# Patient Record
Sex: Male | Born: 1940 | Race: Black or African American | Hispanic: No | Marital: Married | Smoking: Current every day smoker
Health system: Southern US, Community
[De-identification: ages and names within clinical notes are randomized; demographics above are authoritative.]

## PROBLEM LIST (undated history)

## (undated) DIAGNOSIS — M1990 Unspecified osteoarthritis, unspecified site: Secondary | ICD-10-CM

## (undated) DIAGNOSIS — C801 Malignant (primary) neoplasm, unspecified: Secondary | ICD-10-CM

## (undated) DIAGNOSIS — I219 Acute myocardial infarction, unspecified: Secondary | ICD-10-CM

## (undated) DIAGNOSIS — C50919 Malignant neoplasm of unspecified site of unspecified female breast: Secondary | ICD-10-CM

## (undated) DIAGNOSIS — I10 Essential (primary) hypertension: Secondary | ICD-10-CM

## (undated) DIAGNOSIS — E119 Type 2 diabetes mellitus without complications: Secondary | ICD-10-CM

## (undated) HISTORY — DX: Type 2 diabetes mellitus without complications: E11.9

## (undated) HISTORY — PX: BREAST SURGERY: SHX581

## (undated) HISTORY — DX: Essential (primary) hypertension: I10

## (undated) HISTORY — PX: SHOULDER ARTHROSCOPY: SHX128

## (undated) HISTORY — DX: Unspecified osteoarthritis, unspecified site: M19.90

## (undated) HISTORY — DX: Acute myocardial infarction, unspecified: I21.9

## (undated) HISTORY — PX: PORT-A-CATH REMOVAL: SHX5289

## (undated) HISTORY — DX: Malignant (primary) neoplasm, unspecified: C80.1

---

## 2005-06-04 HISTORY — PX: OTHER SURGICAL HISTORY: SHX169

## 2005-07-22 ENCOUNTER — Emergency Department: Payer: Self-pay | Admitting: Unknown Physician Specialty

## 2005-08-11 ENCOUNTER — Ambulatory Visit: Payer: Self-pay

## 2005-09-10 ENCOUNTER — Ambulatory Visit: Payer: Self-pay | Admitting: Orthopaedic Surgery

## 2005-09-17 ENCOUNTER — Ambulatory Visit: Payer: Self-pay | Admitting: Orthopaedic Surgery

## 2008-06-04 DIAGNOSIS — C801 Malignant (primary) neoplasm, unspecified: Secondary | ICD-10-CM

## 2008-06-04 HISTORY — DX: Malignant (primary) neoplasm, unspecified: C80.1

## 2008-06-04 HISTORY — PX: PORT A CATH REVISION: SHX6033

## 2008-07-05 ENCOUNTER — Ambulatory Visit: Payer: Self-pay | Admitting: Oncology

## 2008-07-08 ENCOUNTER — Ambulatory Visit: Payer: Self-pay

## 2008-07-28 ENCOUNTER — Ambulatory Visit: Payer: Self-pay | Admitting: Oncology

## 2008-08-02 ENCOUNTER — Ambulatory Visit: Payer: Self-pay | Admitting: Oncology

## 2008-08-04 ENCOUNTER — Ambulatory Visit: Payer: Self-pay | Admitting: Oncology

## 2008-08-13 ENCOUNTER — Ambulatory Visit: Payer: Self-pay | Admitting: General Surgery

## 2008-09-02 ENCOUNTER — Ambulatory Visit: Payer: Self-pay | Admitting: Oncology

## 2008-10-02 ENCOUNTER — Ambulatory Visit: Payer: Self-pay | Admitting: Oncology

## 2008-11-02 ENCOUNTER — Ambulatory Visit: Payer: Self-pay | Admitting: Oncology

## 2008-12-02 ENCOUNTER — Ambulatory Visit: Payer: Self-pay | Admitting: Oncology

## 2008-12-16 ENCOUNTER — Ambulatory Visit: Payer: Self-pay | Admitting: General Surgery

## 2008-12-24 ENCOUNTER — Ambulatory Visit: Payer: Self-pay | Admitting: General Surgery

## 2009-01-02 ENCOUNTER — Ambulatory Visit: Payer: Self-pay | Admitting: Oncology

## 2009-02-02 ENCOUNTER — Ambulatory Visit: Payer: Self-pay | Admitting: Oncology

## 2009-03-04 ENCOUNTER — Ambulatory Visit: Payer: Self-pay | Admitting: Oncology

## 2009-04-04 ENCOUNTER — Ambulatory Visit: Payer: Self-pay | Admitting: Oncology

## 2009-05-04 ENCOUNTER — Ambulatory Visit: Payer: Self-pay | Admitting: Oncology

## 2009-06-04 ENCOUNTER — Ambulatory Visit: Payer: Self-pay | Admitting: Oncology

## 2009-06-04 DIAGNOSIS — C50919 Malignant neoplasm of unspecified site of unspecified female breast: Secondary | ICD-10-CM

## 2009-06-04 HISTORY — PX: MASTECTOMY: SHX3

## 2009-06-04 HISTORY — DX: Malignant neoplasm of unspecified site of unspecified female breast: C50.919

## 2009-07-05 ENCOUNTER — Ambulatory Visit: Payer: Self-pay | Admitting: Oncology

## 2009-08-02 ENCOUNTER — Ambulatory Visit: Payer: Self-pay | Admitting: Oncology

## 2009-09-12 ENCOUNTER — Ambulatory Visit: Payer: Self-pay | Admitting: Oncology

## 2009-10-02 ENCOUNTER — Ambulatory Visit: Payer: Self-pay | Admitting: Oncology

## 2009-10-02 ENCOUNTER — Ambulatory Visit: Payer: Self-pay | Admitting: Radiation Oncology

## 2009-10-20 IMAGING — CR DG CHEST 1V PORT
1 series · 1 of 1 positions shown · non-contrast
Comparison: none

REASON FOR EXAM: post portacath placement
COMMENTS:

[view not recorded]
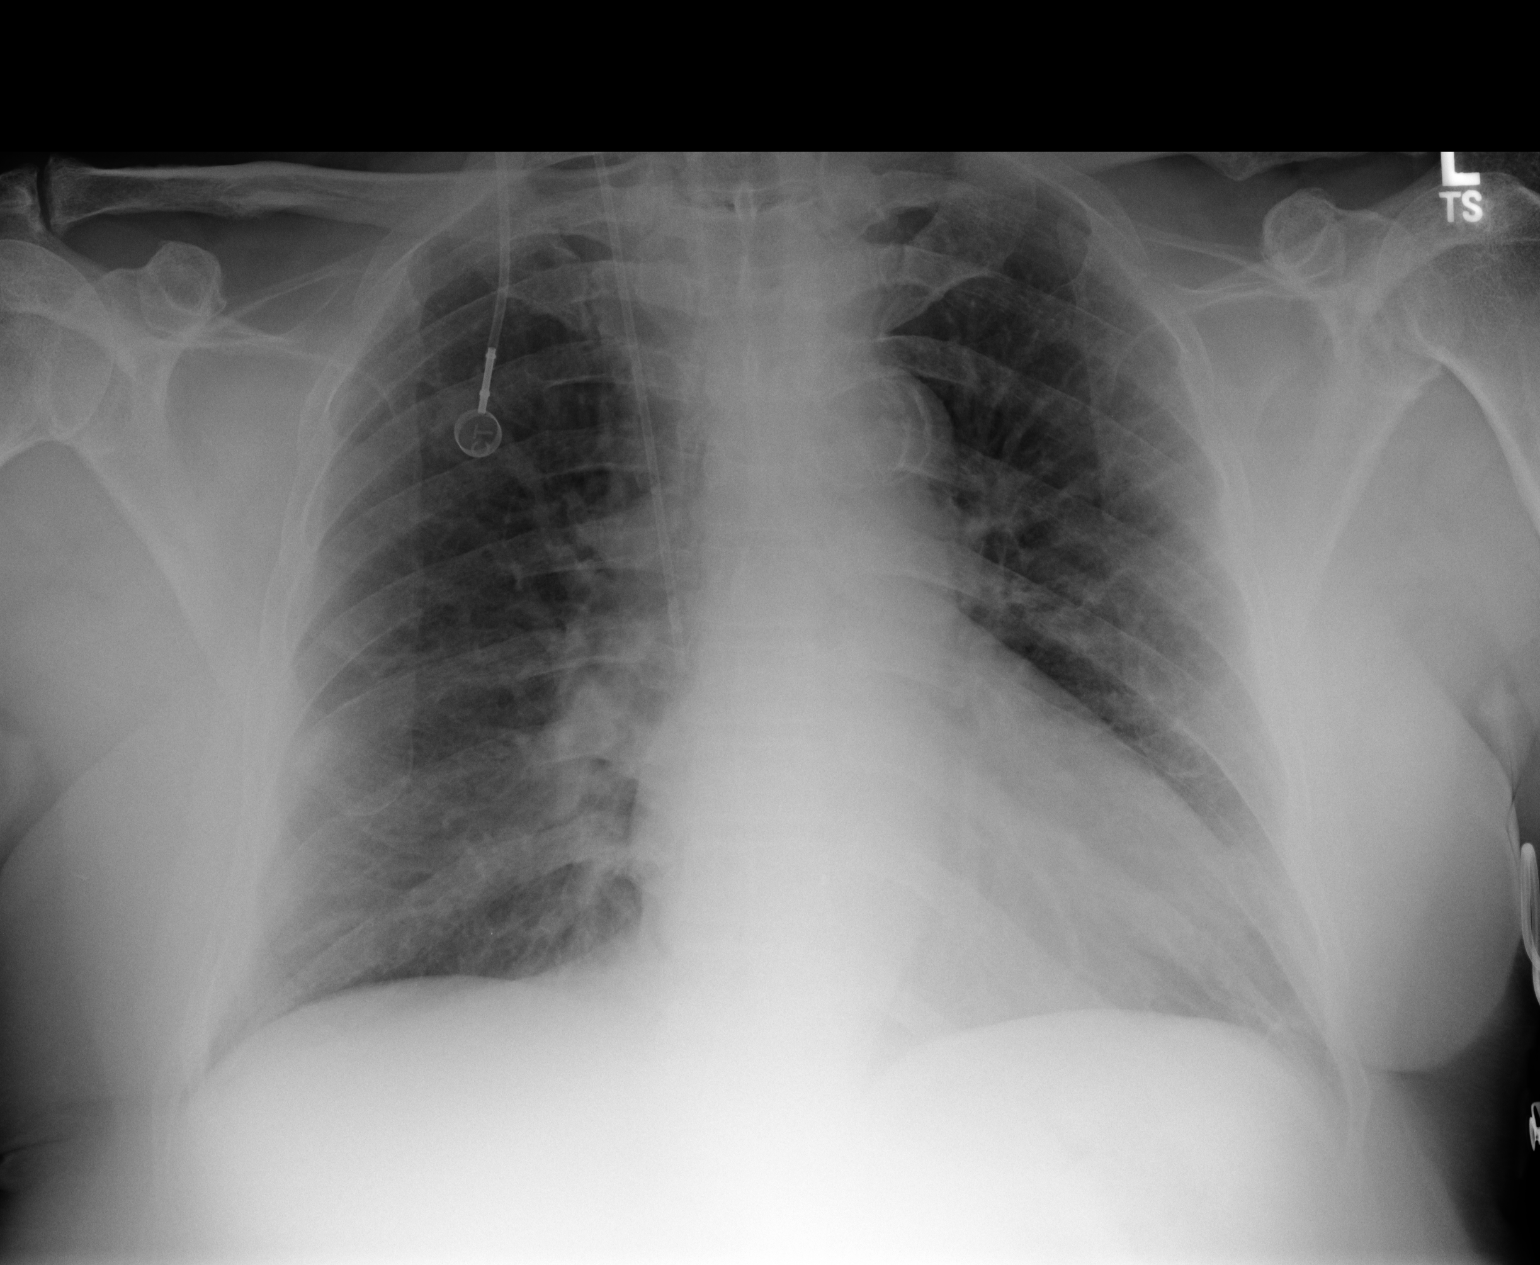

[1 of 1 positions shown; findings below may reference images not displayed]

PROCEDURE:     DXR - DXR PORTABLE CHEST SINGLE VIEW  - August 13, 2008 [DATE]

RESULT:     Comparison is made to the exam of 09/10/2005. A right-sided
central venous catheter has been placed. The port and tube appear to be
within normal limits. The tip of the tube is in the distal superior vena
cava near the right atrium. There is no pneumothorax. Atherosclerotic
calcification is present. The cardiac silhouette appears to be within normal
limits.
IMPRESSION: Port-A-Cath device present as described.

## 2009-11-02 ENCOUNTER — Ambulatory Visit: Payer: Self-pay | Admitting: Radiation Oncology

## 2009-11-02 ENCOUNTER — Ambulatory Visit: Payer: Self-pay | Admitting: Oncology

## 2009-12-02 ENCOUNTER — Ambulatory Visit: Payer: Self-pay | Admitting: Oncology

## 2010-01-04 ENCOUNTER — Ambulatory Visit: Payer: Self-pay | Admitting: Oncology

## 2010-02-02 ENCOUNTER — Ambulatory Visit: Payer: Self-pay | Admitting: Oncology

## 2010-03-02 IMAGING — NM NM SENTINAL NODE INJECTION (BREAST) - NO REPORT
1 series · 2 of 2 positions shown · non-contrast
Comparison: none

REASON FOR EXAM: left breast mastectomy with MEAD   [DATE]  SURG [DATE]
   [id number]  [id number]  [id number]
COMMENTS:
TECHNIQUE: Using sterile technique and a 27 gauge, 1/2 inch needle, the
radiopharmaceutical was injected into the subcutaneous periareolar tissues
of the left breast. Planar images were obtained in the anterior and left
lateral projections, both with and without the use of a Oo-30 transmission
source. The patient's arm was abducted at 90 degrees from the body for the
anterior  images, and raised over the head for the lateral images.

[Series 1000: sent node breast static · 2.40mm/px · 2 of 2 frames shown]
[frame 1/2]
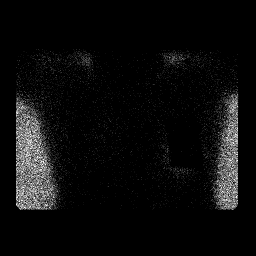
[frame 2/2]
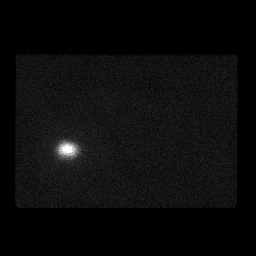

[2 of 2 positions shown; findings below may reference images not displayed]

PROCEDURE:     NM  - NM SENTINEL NODE  BREAST  - December 24, 2008  [DATE]

RESULT:     Comparison:  No comparison.

Radiopharmaceutical:  1.14 mCi [id number] micro filtered sulfur colloid in 2 ml
volume, injected subcutaneously into the periareolar tissues of the left
breast.

Clinical Indication:  68-year-old male with carcinoma of the left breast.
Lymphatic mapping is requested prior to planned left axillary sentinel node
biopsy for interoperative localization.
FINDINGS: Images obtained at one minutes post injection demonstrate
localization of radiotracer in the region of the left periareolar soft
tissue.
IMPRESSION: Successful injection of radiotracer into the left breast
periareolar soft tissues for interoperative localization of left breast
sentinel lymph nodes with a gamma probe.

## 2010-03-04 ENCOUNTER — Ambulatory Visit: Payer: Self-pay | Admitting: Oncology

## 2010-04-04 ENCOUNTER — Ambulatory Visit: Payer: Self-pay | Admitting: Oncology

## 2010-08-07 ENCOUNTER — Ambulatory Visit: Payer: Self-pay | Admitting: Oncology

## 2010-08-08 LAB — CANCER ANTIGEN 27.29: CA 27.29: 25.9 U/mL (ref 0.0–38.6)

## 2010-09-03 ENCOUNTER — Ambulatory Visit: Payer: Self-pay | Admitting: Oncology

## 2010-09-03 IMAGING — NM NM  CARDIAC MUGA REST SCAN 2 0F 2
1 series · 1 of 1 positions shown · non-contrast
Comparison: none

REASON FOR EXAM: breast CA  175.9  high risk meds
COMMENTS:

[Series 1000: lao 45 · 6.59mm/px · 1 of 1 slices shown]
[im 1/1]
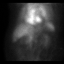

[1 of 1 positions shown; findings below may reference images not displayed]

PROCEDURE:     NM  - NM REST MUGA SCAN [DATE] OF [DATE]  - [DATE] [DATE] [DATE]  [DATE]

RESULT:     Following intravenous administration of 3.0 ml PYP and 24.76 mCi
Technetium 99m Pertechnetate, Rest MUGA scan was performed.

Review of the left ventricular ejection fraction shows no akinetic or
dyskinetic myocardial segments. The left ventricular ejection fraction
measures 53.3% which is within the normal range. The left ventricular
ejection fraction on the prior exam of 03/22/2009 measured 54.5%.
IMPRESSION: The left ventricular ejection fraction measures 53.3% which
is essentially unchanged as compared to the prior exam.

## 2011-01-25 ENCOUNTER — Ambulatory Visit: Payer: Self-pay | Admitting: Oncology

## 2011-01-26 LAB — CANCER ANTIGEN 27.29: CA 27.29: 22.9 U/mL (ref 0.0–38.6)

## 2011-02-03 ENCOUNTER — Ambulatory Visit: Payer: Self-pay | Admitting: Oncology

## 2011-06-05 DIAGNOSIS — I219 Acute myocardial infarction, unspecified: Secondary | ICD-10-CM

## 2011-06-05 HISTORY — PX: CORONARY ANGIOPLASTY WITH STENT PLACEMENT: SHX49

## 2011-06-05 HISTORY — DX: Acute myocardial infarction, unspecified: I21.9

## 2011-07-26 ENCOUNTER — Ambulatory Visit: Payer: Self-pay | Admitting: Oncology

## 2011-07-26 LAB — CBC CANCER CENTER
Basophil #: 0 x10 3/mm (ref 0.0–0.1)
Basophil %: 0.2 %
Eosinophil %: 0.8 %
Lymphocyte #: 0.9 x10 3/mm — ABNORMAL LOW (ref 1.0–3.6)
Lymphocyte %: 18.3 %
MCV: 90 fL (ref 80–100)
Monocyte #: 0.5 x10 3/mm (ref 0.0–0.7)
Monocyte %: 8.8 %
Neutrophil %: 71.9 %
Platelet: 170 x10 3/mm (ref 150–440)
RBC: 4.69 10*6/uL (ref 4.40–5.90)
RDW: 16.5 % — ABNORMAL HIGH (ref 11.5–14.5)
WBC: 5.2 x10 3/mm (ref 3.8–10.6)

## 2011-07-26 LAB — COMPREHENSIVE METABOLIC PANEL
Albumin: 3.6 g/dL (ref 3.4–5.0)
Alkaline Phosphatase: 137 U/L — ABNORMAL HIGH (ref 50–136)
Anion Gap: 6 — ABNORMAL LOW (ref 7–16)
BUN: 20 mg/dL — ABNORMAL HIGH (ref 7–18)
Calcium, Total: 9.7 mg/dL (ref 8.5–10.1)
Co2: 31 mmol/L (ref 21–32)
EGFR (Non-African Amer.): 57 — ABNORMAL LOW
Glucose: 256 mg/dL — ABNORMAL HIGH (ref 65–99)
Osmolality: 291 (ref 275–301)
SGPT (ALT): 39 U/L
Sodium: 140 mmol/L (ref 136–145)

## 2011-07-27 LAB — CANCER ANTIGEN 27.29: CA 27.29: 30 U/mL (ref 0.0–38.6)

## 2011-08-03 ENCOUNTER — Ambulatory Visit: Payer: Self-pay | Admitting: Oncology

## 2011-08-07 DIAGNOSIS — I2109 ST elevation (STEMI) myocardial infarction involving other coronary artery of anterior wall: Secondary | ICD-10-CM | POA: Insufficient documentation

## 2011-08-09 ENCOUNTER — Ambulatory Visit: Payer: Self-pay | Admitting: Oncology

## 2011-08-21 ENCOUNTER — Encounter: Payer: Self-pay | Admitting: Cardiology

## 2011-09-03 ENCOUNTER — Encounter: Payer: Self-pay | Admitting: Cardiology

## 2011-09-03 ENCOUNTER — Ambulatory Visit: Payer: Self-pay | Admitting: Oncology

## 2011-10-03 ENCOUNTER — Encounter: Payer: Self-pay | Admitting: Cardiology

## 2012-02-19 ENCOUNTER — Ambulatory Visit: Payer: Self-pay | Admitting: Oncology

## 2012-02-19 LAB — CBC CANCER CENTER
Basophil #: 0.2 x10 3/mm — ABNORMAL HIGH (ref 0.0–0.1)
Eosinophil #: 0.1 x10 3/mm (ref 0.0–0.7)
HCT: 40.9 % (ref 40.0–52.0)
HGB: 13.7 g/dL (ref 13.0–18.0)
Lymphocyte #: 0.9 x10 3/mm — ABNORMAL LOW (ref 1.0–3.6)
Lymphocyte %: 13.4 %
MCH: 30.4 pg (ref 26.0–34.0)
MCHC: 33.5 g/dL (ref 32.0–36.0)
MCV: 91 fL (ref 80–100)
Monocyte %: 8.8 %
Neutrophil #: 4.7 x10 3/mm (ref 1.4–6.5)
Platelet: 171 x10 3/mm (ref 150–440)
RDW: 16.7 % — ABNORMAL HIGH (ref 11.5–14.5)

## 2012-02-19 LAB — COMPREHENSIVE METABOLIC PANEL
Albumin: 3.8 g/dL (ref 3.4–5.0)
Alkaline Phosphatase: 95 U/L (ref 50–136)
BUN: 18 mg/dL (ref 7–18)
Chloride: 106 mmol/L (ref 98–107)
Co2: 26 mmol/L (ref 21–32)
Creatinine: 1.34 mg/dL — ABNORMAL HIGH (ref 0.60–1.30)
Osmolality: 285 (ref 275–301)
Potassium: 3.6 mmol/L (ref 3.5–5.1)
SGOT(AST): 23 U/L (ref 15–37)
SGPT (ALT): 21 U/L (ref 12–78)
Total Protein: 7.7 g/dL (ref 6.4–8.2)

## 2012-02-20 LAB — CANCER ANTIGEN 27.29: CA 27.29: 24.1 U/mL (ref 0.0–38.6)

## 2012-03-04 ENCOUNTER — Ambulatory Visit: Payer: Self-pay | Admitting: Oncology

## 2012-08-11 ENCOUNTER — Ambulatory Visit: Payer: Self-pay | Admitting: Oncology

## 2012-08-18 ENCOUNTER — Ambulatory Visit: Payer: Self-pay | Admitting: Oncology

## 2012-08-19 LAB — COMPREHENSIVE METABOLIC PANEL
Albumin: 3.7 g/dL (ref 3.4–5.0)
Alkaline Phosphatase: 97 U/L (ref 50–136)
Anion Gap: 6 — ABNORMAL LOW (ref 7–16)
BUN: 14 mg/dL (ref 7–18)
Bilirubin,Total: 0.4 mg/dL (ref 0.2–1.0)
Co2: 31 mmol/L (ref 21–32)
EGFR (African American): >60
Glucose: 94 mg/dL (ref 65–99)
Osmolality: 280 (ref 275–301)
SGOT(AST): 23 U/L (ref 15–37)
SGPT (ALT): 28 U/L (ref 12–78)
Sodium: 140 mmol/L (ref 136–145)

## 2012-08-19 LAB — CBC CANCER CENTER
Basophil #: 0.1 x10 3/mm (ref 0.0–0.1)
Eosinophil #: 0.1 x10 3/mm (ref 0.0–0.7)
Eosinophil %: 1.1 %
HCT: 42.6 % (ref 40.0–52.0)
Lymphocyte %: 21.8 %
MCHC: 33.6 g/dL (ref 32.0–36.0)
MCV: 90 fL (ref 80–100)
Neutrophil %: 65.8 %
Platelet: 186 x10 3/mm (ref 150–440)
RBC: 4.73 10*6/uL (ref 4.40–5.90)
RDW: 16.1 % — ABNORMAL HIGH (ref 11.5–14.5)

## 2012-08-20 LAB — CANCER ANTIGEN 27.29: CA 27.29: 26.2 U/mL (ref 0.0–38.6)

## 2012-08-25 ENCOUNTER — Ambulatory Visit: Payer: Self-pay | Admitting: Oncology

## 2012-09-02 ENCOUNTER — Ambulatory Visit: Payer: Self-pay | Admitting: Oncology

## 2012-09-03 ENCOUNTER — Ambulatory Visit (INDEPENDENT_AMBULATORY_CARE_PROVIDER_SITE_OTHER): Payer: Medicare Other | Admitting: General Surgery

## 2012-09-03 ENCOUNTER — Encounter: Payer: Self-pay | Admitting: General Surgery

## 2012-09-03 ENCOUNTER — Telehealth: Payer: Self-pay | Admitting: *Deleted

## 2012-09-03 VITALS — BP 140/70 | HR 70 | Resp 18 | Ht 71.0 in | Wt 245.0 lb

## 2012-09-03 DIAGNOSIS — Z853 Personal history of malignant neoplasm of breast: Secondary | ICD-10-CM | POA: Insufficient documentation

## 2012-09-03 DIAGNOSIS — N63 Unspecified lump in unspecified breast: Secondary | ICD-10-CM

## 2012-09-03 NOTE — Telephone Encounter (Signed)
Patient has been left a message on home and cell numbers to call the office. I also called his work number but he was not there. Patient is on Effient and biopsy will not be able to be completed on 09-05-12. This patient will need to take last dose on Thursday, 09-04-12, if we can reschedule his biopsy to 09-09-12 at 8:30 am.

## 2012-09-03 NOTE — Patient Instructions (Addendum)
Patient to have a right core biopsy . He is agreeable. Pt is on Effient, needs to stop this fo at least 3 days prior to procedure

## 2012-09-03 NOTE — Progress Notes (Signed)
Patient ID: Benjamin Krause, male   DO: 06-02-1941, 72 y.o.   MRM: [id number]  Chief Complaint  Patient presents with  . Breast Problem    HPI Benjamin Krause is a 72 y.o. male.  Patient here today for right breast evaluation referred by Dr Doylene Canning. 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 The patient has been treated with mastectomy, chemotherapy and radiation therapy. He had a heart attack in Feb 2013 which he was treated with a cardiac stent.Resent follow up mammogram show irregularl nodular below right nipple  HPI  Past Medical History  Diagnosis Date  . Cancer 2010    left breast  . Hypertension   . Heart attack   . Arthritis   . Diabetes mellitus without complication     Past Surgical History  Procedure Laterality Date  . Breast surgery    . Shoulder arthroscopy    . Coronary angioplasty with stent placement  2013  . I&d cyst on upper back   2007  . Port a cath revision  2010  . Mastectomy Left   . Port-a-cath removal      No family history on file.  Social History History  Substance Use Topics  . Smoking status: Current Every Day Smoker -- 1.00 packs/day for 40 years    Types: Cigarettes  . Smokeless tobacco: Never Used  . Alcohol Use: No    No Known Allergies  Current Outpatient Prescriptions  Medication Sig Dispense Refill  . atorvastatin (LIPITOR) 80 MG tablet       . EFFIENT 10 MG TABS       . letrozole (FEMARA) 2.5 MG tablet       . lisinopril (PRINIVIL,ZESTRIL) 2.5 MG tablet       . metFORMIN (GLUCOPHAGE) 1000 MG tablet       . metoprolol succinate (TOPROL-XL) 50 MG 24 hr tablet        No current facility-administered medications for this visit.    Review of Systems Review of Systems  Constitutional: Negative.   Respiratory: Negative.   Cardiovascular: Negative.     Blood pressure 140/70, pulse 70, resp. rate 18, height 5\' 11"  (1.803 m), weight 245 lb (111.131 kg).  Physical Exam Physical Exam  Constitutional: He  appears well-developed.  Eyes: Conjunctivae are normal.  Neck: Normal range of motion. Neck supple.  Cardiovascular: Normal rate, regular rhythm and normal heart sounds.   Pulmonary/Chest: Effort normal and breath sounds normal. Right breast exhibits no inverted nipple, no mass, no nipple discharge, no skin change and no tenderness.    Abdominal: Soft. Normal appearance and bowel sounds are normal. There is no tenderness.  Lymphadenopathy:    He has no cervical adenopathy.    He has no axillary adenopathy.    Data Reviewed Mammogram right showed a tiny irregular density behind nipple. US showed similar finding with mild shadowing.   Assessment    New right breastr finding on imaging, not palpable. S/P left mastectomy for CA     Plan    Core biopsy right breast        Currie Paris 09/03/2012, 11:26 AM

## 2012-09-04 NOTE — Telephone Encounter (Signed)
Patient is aware of all instructions and verbalizes understanding.

## 2012-09-05 ENCOUNTER — Ambulatory Visit: Payer: Medicare Other | Admitting: General Surgery

## 2012-09-09 ENCOUNTER — Ambulatory Visit (INDEPENDENT_AMBULATORY_CARE_PROVIDER_SITE_OTHER): Payer: Medicare Other | Admitting: General Surgery

## 2012-09-09 ENCOUNTER — Encounter: Payer: Self-pay | Admitting: General Surgery

## 2012-09-09 VITALS — BP 134/64 | HR 72 | Resp 16 | Ht 71.0 in | Wt 249.0 lb

## 2012-09-09 DIAGNOSIS — N63 Unspecified lump in unspecified breast: Secondary | ICD-10-CM

## 2012-09-09 HISTORY — PX: BREAST BIOPSY: SHX20

## 2012-09-09 NOTE — Patient Instructions (Addendum)
CARE AFTER BREAST BIOPSY  1. Leave the dressing on that your doctor applied after surgery. It is waterproof. You may bathe, shower and/or swim. The dressing will probably remain intact until your return office visit. If the dressing comes off, you will see small strips of tape against your skin on the incision. Do not remove these strips.  2. You may want to use a gauze,cloth or similar protection in your bra to prevent rubbing against your dressing and incision. This is not necessary, but you may feel more comfortable doing so.  3. It is recommended that you wear a bra day and night to give support to the breast. This will prevent the weight of the breast from pulling on the incision.  4. Your breast will feel hard and lumpy under the incision. Do not be alarmed. This is the underlying stitching of tissue. Softening of this tissue will occur in time.  5. Make sure you call the office and schedule an appointment in one week after your surgery. The office phone number is [phone]. The nurses at [facility] may have already done this for you.  6. You will notice about a week after your office visit that the strips of the tape on your incision will begin to loosen. These may then be removed.  7. Report to your doctor any of the following:  * Severe pain not relieved by your pain medication  *Redness of the incision  * Drainage from the incision  *Fever greater than 101 degrees   Start back on medication on Wednesday.

## 2012-09-09 NOTE — Progress Notes (Signed)
Patient ID: Benjamin Krause, male   DOB: Dec 09, 1940, 72 y.o.   MRN: [id number]  Chief Complaint  Patient presents with  . Other    core biopsy     HPI Benjamin Krause is a 72 y.o. male right breast core biopsy . HPI  Past Medical History  Diagnosis Date  . Cancer 2010    left breast  . Hypertension   . Arthritis   . Diabetes mellitus without complication   . Heart attack 2013    Past Surgical History  Procedure Laterality Date  . Breast surgery    . Shoulder arthroscopy    . Coronary angioplasty with stent placement  2013  . I&d cyst on upper back   2007  . Port a cath revision  2010  . Mastectomy Left   . Port-a-cath removal      History reviewed. No pertinent family history.  Social History History  Substance Use Topics  . Smoking status: Current Every Day Smoker -- 1.00 packs/day for 40 years    Types: Cigarettes  . Smokeless tobacco: Never Used  . Alcohol Use: No    No Known Allergies  Current Outpatient Prescriptions  Medication Sig Dispense Refill  . atorvastatin (LIPITOR) 80 MG tablet       . EFFIENT 10 MG TABS       . letrozole (FEMARA) 2.5 MG tablet       . lisinopril (PRINIVIL,ZESTRIL) 2.5 MG tablet       . metFORMIN (GLUCOPHAGE) 1000 MG tablet       . metoprolol succinate (TOPROL-XL) 50 MG 24 hr tablet        No current facility-administered medications for this visit.    Review of Systems Review of Systems  Constitutional: Negative.   Respiratory: Negative.   Cardiovascular: Negative.     Blood pressure 134/64, pulse 72, resp. rate 16, height 5\' 11"  (1.803 m), weight 249 lb (112.946 kg).  Physical Exam Physical Exam Pt was here for planned core biopsy of right breast mass  Data Reviewed    Assessment          Plan    Core biopsy completed        SANKAR,SEEPLAPUTHUR G 09/09/2012, 9:17 AM

## 2012-09-10 LAB — PATHOLOGY

## 2012-09-16 ENCOUNTER — Encounter: Payer: Self-pay | Admitting: General Surgery

## 2012-10-03 ENCOUNTER — Telehealth: Payer: Self-pay | Admitting: *Deleted

## 2012-10-03 NOTE — Telephone Encounter (Signed)
Patient aware pathology was benign.

## 2012-12-02 DIAGNOSIS — Z720 Tobacco use: Secondary | ICD-10-CM | POA: Insufficient documentation

## 2012-12-02 DIAGNOSIS — I2510 Atherosclerotic heart disease of native coronary artery without angina pectoris: Secondary | ICD-10-CM | POA: Insufficient documentation

## 2013-02-16 ENCOUNTER — Ambulatory Visit: Payer: Self-pay | Admitting: Oncology

## 2013-02-17 LAB — CBC CANCER CENTER
Basophil #: 0.1 x10 3/mm (ref 0.0–0.1)
Basophil %: 1.2 %
Eosinophil %: 1.1 %
Lymphocyte %: 29.1 %
MCH: 30.5 pg (ref 26.0–34.0)
Monocyte #: 0.9 x10 3/mm (ref 0.2–1.0)
Monocyte %: 13.1 %
Neutrophil #: 3.8 x10 3/mm (ref 1.4–6.5)
Platelet: 185 x10 3/mm (ref 150–440)
RBC: 4.77 10*6/uL (ref 4.40–5.90)
RDW: 16.5 % — ABNORMAL HIGH (ref 11.5–14.5)
WBC: 6.9 x10 3/mm (ref 3.8–10.6)

## 2013-02-17 LAB — COMPREHENSIVE METABOLIC PANEL
Albumin: 3.7 g/dL (ref 3.4–5.0)
Alkaline Phosphatase: 119 U/L (ref 50–136)
Calcium, Total: 9.9 mg/dL (ref 8.5–10.1)
Chloride: 104 mmol/L (ref 98–107)
Co2: 29 mmol/L (ref 21–32)
EGFR (African American): >60
Glucose: 96 mg/dL (ref 65–99)
Osmolality: 283 (ref 275–301)
Potassium: 3.8 mmol/L (ref 3.5–5.1)
SGOT(AST): 18 U/L (ref 15–37)
SGPT (ALT): 21 U/L (ref 12–78)
Sodium: 142 mmol/L (ref 136–145)
Total Protein: 7.4 g/dL (ref 6.4–8.2)

## 2013-02-26 ENCOUNTER — Ambulatory Visit (INDEPENDENT_AMBULATORY_CARE_PROVIDER_SITE_OTHER): Payer: Medicare Other | Admitting: General Surgery

## 2013-02-26 ENCOUNTER — Encounter: Payer: Self-pay | Admitting: General Surgery

## 2013-02-26 VITALS — BP 130/62 | HR 72 | Resp 16 | Wt 248.0 lb

## 2013-02-26 DIAGNOSIS — Z853 Personal history of malignant neoplasm of breast: Secondary | ICD-10-CM

## 2013-02-26 DIAGNOSIS — I890 Lymphedema, not elsewhere classified: Secondary | ICD-10-CM

## 2013-02-26 NOTE — Patient Instructions (Addendum)
Call office for any new breast issues or concerns. Wear Lymphedema sleeve get from [employer]

## 2013-02-26 NOTE — Progress Notes (Signed)
Patient ID: Benjamin Krause, male   DOB: [birth year], 72 y.o.   MRN: [id number]  Chief Complaint  Patient presents with  . Follow-up    HPI Benjamin Krause is a 72 y.o. male.  Here today for follow up breast cancer. 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 The patient was treated with mastectomy, chemotherapy and radiation therapy. On 09-11-12 a right breat biopsy was done and it was benign. No new complaints.  HPI  Past Medical History  Diagnosis Date  . Hypertension   . Arthritis   . Diabetes mellitus without complication   . Heart attack 2013  . Cancer 2010    left breast  invasive ductal carcinoma    Past Surgical History  Procedure Laterality Date  . Shoulder arthroscopy    . Coronary angioplasty with stent placement  2013  . I&d cyst on upper back   2007  . Port a cath revision  2010  . Mastectomy Left   . Port-a-cath removal    . Breast surgery    . Breast biopsy Right 09-09-12    BENIGN BREAST TISSUE WITH FIBROSIS    History reviewed. No pertinent family history.  Social History History  Substance Use Topics  . Smoking status: Current Every Day Smoker -- 1.00 packs/day for 40 years    Types: Cigarettes  . Smokeless tobacco: Never Used  . Alcohol Use: No    No Known Allergies  Current Outpatient Prescriptions  Medication Sig Dispense Refill  . atorvastatin (LIPITOR) 80 MG tablet       . letrozole (FEMARA) 2.5 MG tablet       . lisinopril (PRINIVIL,ZESTRIL) 2.5 MG tablet       . metFORMIN (GLUCOPHAGE) 1000 MG tablet       . metoprolol succinate (TOPROL-XL) 50 MG 24 hr tablet        No current facility-administered medications for this visit.    Review of Systems Review of Systems  Constitutional: Negative.   Respiratory: Negative.   Cardiovascular: Negative.     Blood pressure 130/62, pulse 72, resp. rate 16, weight 248 lb (112.492 kg).  Physical Exam Physical Exam  Constitutional: He is oriented to person, place, and  time. He appears well-developed and well-nourished.  Eyes: Conjunctivae are normal. No scleral icterus.  Neck: Neck supple. No thyromegaly present.  Cardiovascular: Normal rate, regular rhythm and normal heart sounds.   Pulses:      Dorsalis pedis pulses are 2+ on the right side, and 0 on the left side.       Posterior tibial pulses are 2+ on the right side, and 2+ on the left side.  Bilateral Lower extremity edema. healed areas of stasis skin changes lower 1/3 of both legs   Pulmonary/Chest: Effort normal and breath sounds normal. Right breast exhibits no inverted nipple, no mass, no nipple discharge, no skin change and no tenderness.  Left mastectomy site well healed Lymphedema left arm present  Abdominal: Soft. Bowel sounds are normal. There is no hepatosplenomegaly. There is no tenderness.  Lymphadenopathy:    He has no cervical adenopathy.    He has no axillary adenopathy.  Neurological: He is alert and oriented to person, place, and time.  Skin: Skin is warm and dry.    Data Reviewed none  Assessment    Stable exam    Plan    Consider lymphedema compression sleeve for left arm, prescription given. Follow up in one year.  SANKAR,SEEPLAPUTHUR G 02/26/2013, 8:27 PM

## 2013-02-27 ENCOUNTER — Encounter: Payer: Self-pay | Admitting: *Deleted

## 2013-03-04 ENCOUNTER — Ambulatory Visit: Payer: Self-pay | Admitting: Oncology

## 2013-08-17 DIAGNOSIS — J209 Acute bronchitis, unspecified: Secondary | ICD-10-CM | POA: Insufficient documentation

## 2013-08-19 ENCOUNTER — Ambulatory Visit: Payer: Self-pay | Admitting: Oncology

## 2013-08-27 LAB — COMPREHENSIVE METABOLIC PANEL
Albumin: 3.1 g/dL — ABNORMAL LOW (ref 3.4–5.0)
Alkaline Phosphatase: 102 U/L
Anion Gap: 8 (ref 7–16)
BILIRUBIN TOTAL: 0.3 mg/dL (ref 0.2–1.0)
BUN: 23 mg/dL — ABNORMAL HIGH (ref 7–18)
CHLORIDE: 104 mmol/L (ref 98–107)
Calcium, Total: 9.7 mg/dL (ref 8.5–10.1)
Co2: 30 mmol/L (ref 21–32)
Creatinine: 1.69 mg/dL — ABNORMAL HIGH (ref 0.60–1.30)
EGFR (African American): 46 — ABNORMAL LOW
EGFR (Non-African Amer.): 39 — ABNORMAL LOW
GLUCOSE: 180 mg/dL — AB (ref 65–99)
OSMOLALITY: 291 (ref 275–301)
POTASSIUM: 4.7 mmol/L (ref 3.5–5.1)
SGOT(AST): 24 U/L (ref 15–37)
SGPT (ALT): 29 U/L (ref 12–78)
Sodium: 142 mmol/L (ref 136–145)
Total Protein: 7.4 g/dL (ref 6.4–8.2)

## 2013-08-27 LAB — CBC CANCER CENTER
BASOS ABS: 0.1 x10 3/mm (ref 0.0–0.1)
BASOS PCT: 0.9 %
EOS PCT: 0.8 %
Eosinophil #: 0.1 x10 3/mm (ref 0.0–0.7)
HCT: 40.9 % (ref 40.0–52.0)
HGB: 13.4 g/dL (ref 13.0–18.0)
LYMPHS PCT: 16.5 %
Lymphocyte #: 1.2 x10 3/mm (ref 1.0–3.6)
MCH: 30.6 pg (ref 26.0–34.0)
MCHC: 32.8 g/dL (ref 32.0–36.0)
MCV: 94 fL (ref 80–100)
Monocyte #: 0.7 x10 3/mm (ref 0.2–1.0)
Monocyte %: 9.6 %
Neutrophil #: 5.4 x10 3/mm (ref 1.4–6.5)
Neutrophil %: 72.2 %
Platelet: 286 x10 3/mm (ref 150–440)
RBC: 4.38 10*6/uL — AB (ref 4.40–5.90)
RDW: 15.4 % — ABNORMAL HIGH (ref 11.5–14.5)
WBC: 7.5 x10 3/mm (ref 3.8–10.6)

## 2013-08-28 LAB — CANCER ANTIGEN 27.29: CA 27.29: 27.5 U/mL (ref 0.0–38.6)

## 2013-09-02 ENCOUNTER — Ambulatory Visit: Payer: Self-pay | Admitting: Oncology

## 2013-09-21 DIAGNOSIS — E119 Type 2 diabetes mellitus without complications: Secondary | ICD-10-CM | POA: Insufficient documentation

## 2013-10-02 ENCOUNTER — Ambulatory Visit: Payer: Self-pay | Admitting: Oncology

## 2013-11-01 IMAGING — US ULTRASOUND RIGHT BREAST
1 series · 14 of 17 positions shown · non-contrast
Comparison: none

REASON FOR EXAM: av rt asymmetric density
COMMENTS:

PROCEDURE:     US  - US BREAST RIGHT  - August 25, 2012 [DATE]
RESULT:     Focused right breast ultrasound dated 08/25/2012

[Series 1: ultrasound right breast · 0.08mm/px · 14 of 17 slices shown]
[im 1/17]
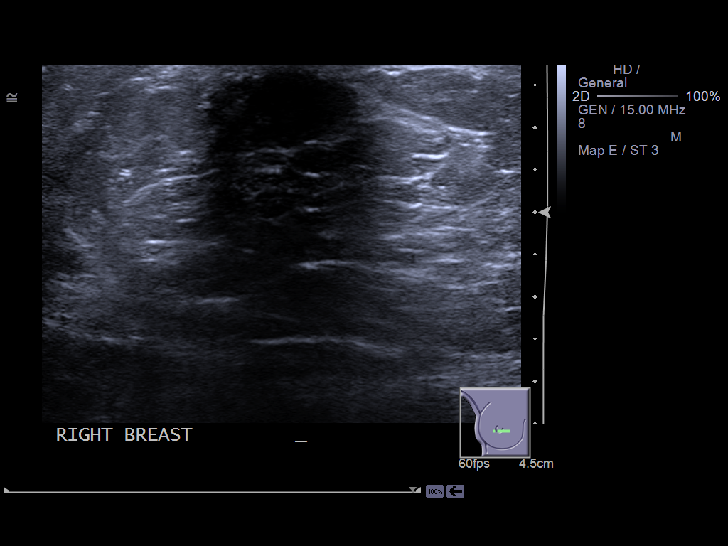
[im 2/17]
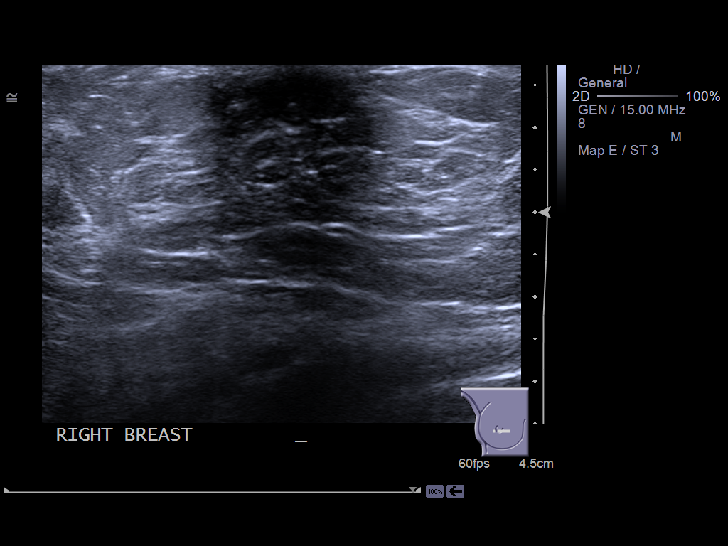
[im 4/17]
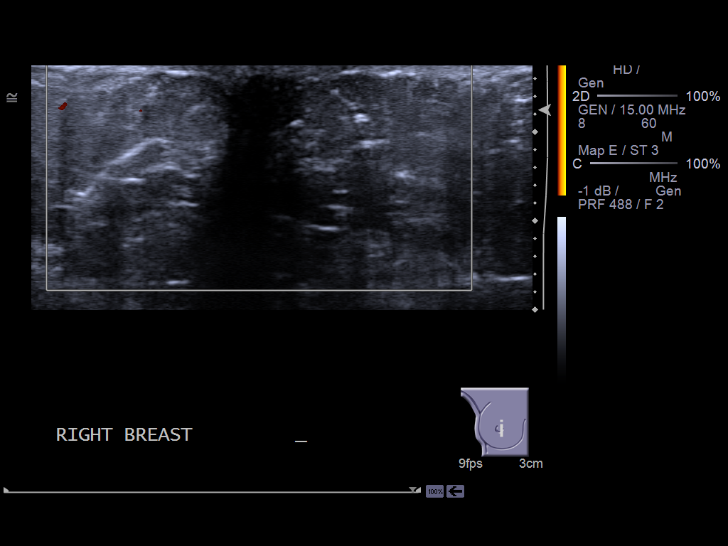
[im 5/17]
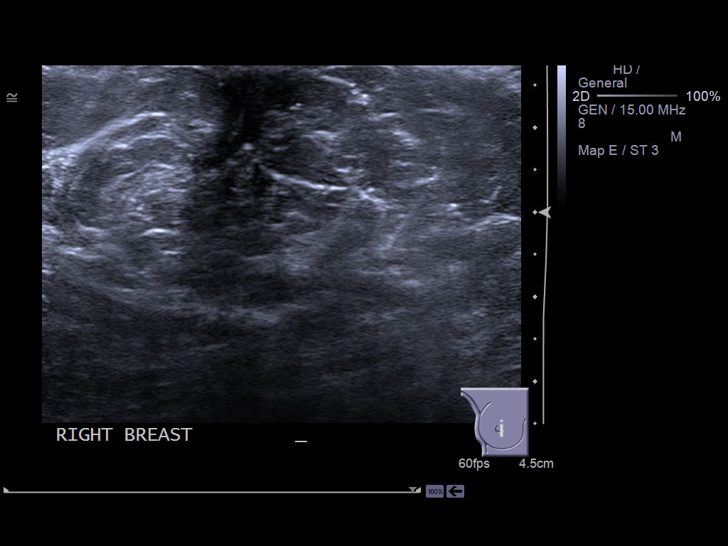
[im 6/17]
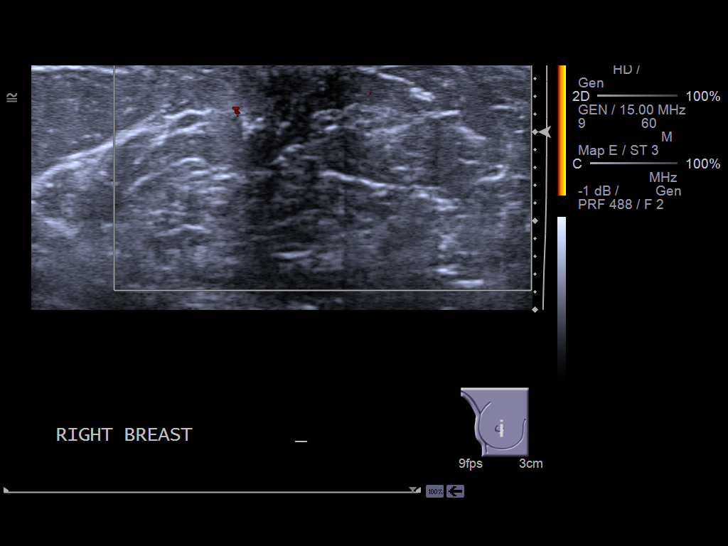
[im 7/17]
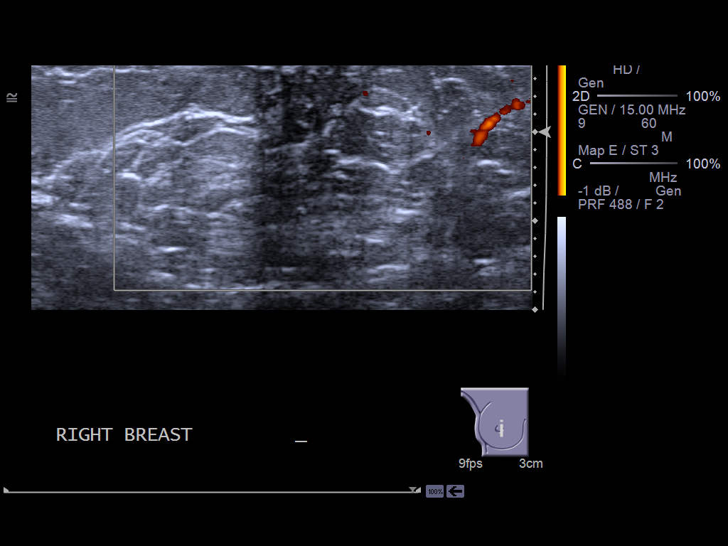
[im 8/17]
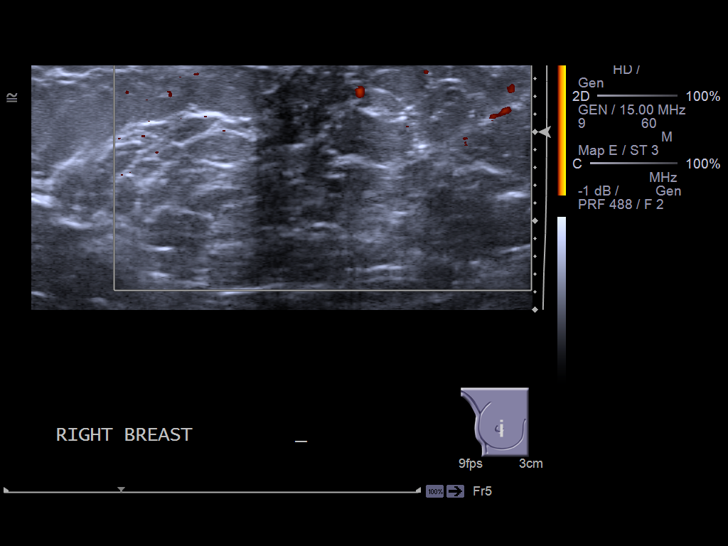
[im 10/17]
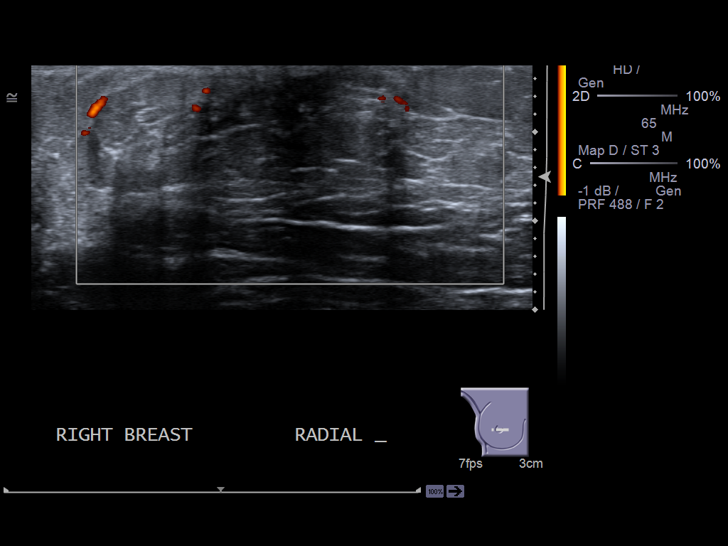
[im 11/17]
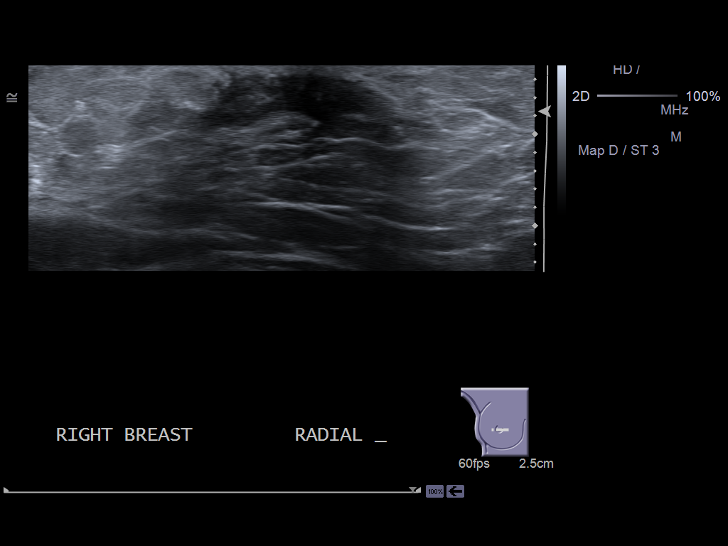
[im 12/17]
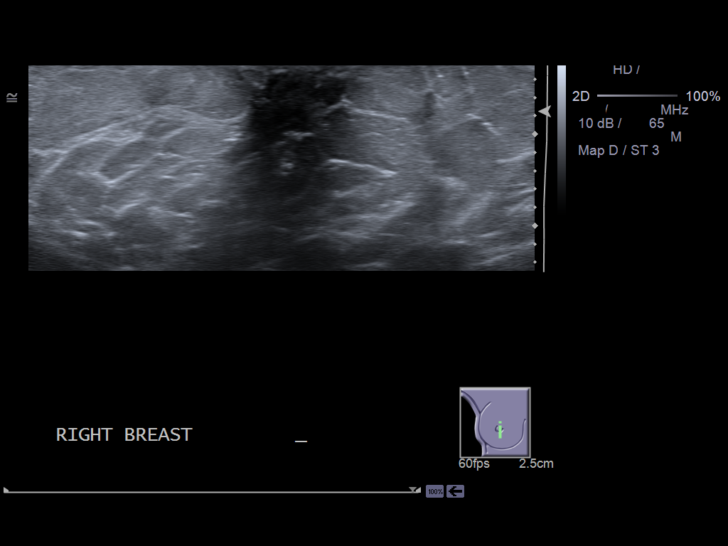
[im 13/17]
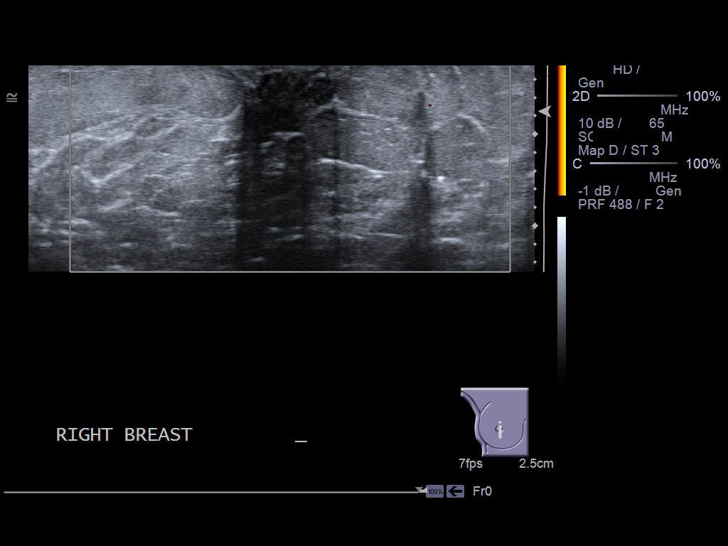
[im 14/17]
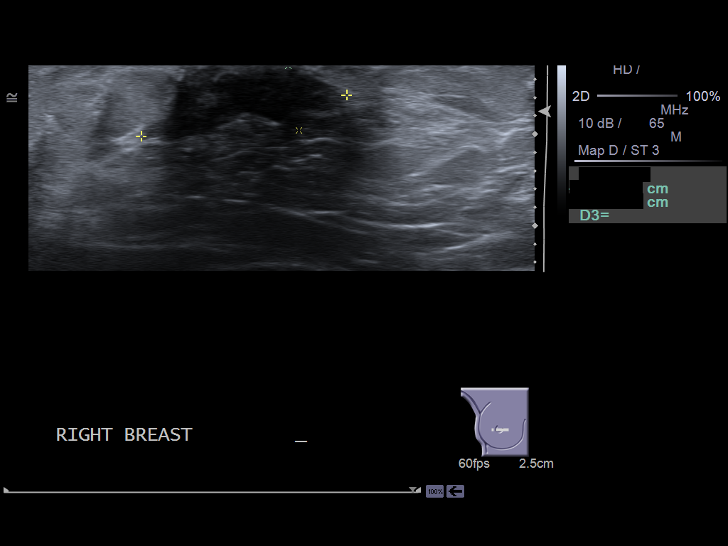
[im 16/17]
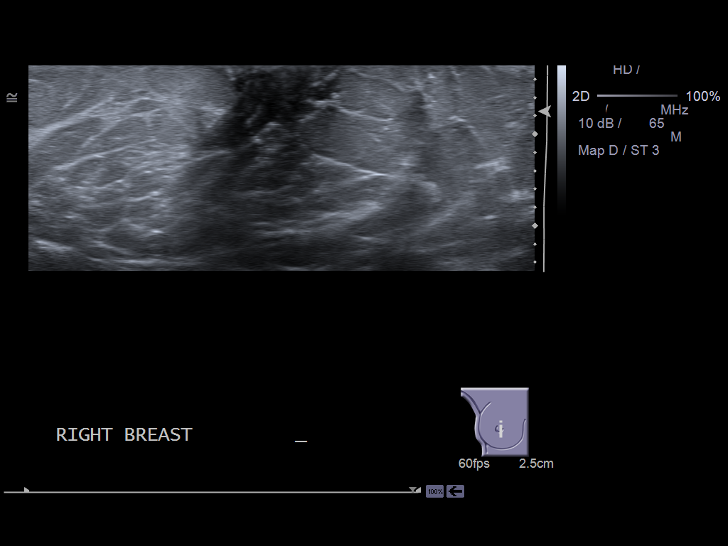
[im 17/17]
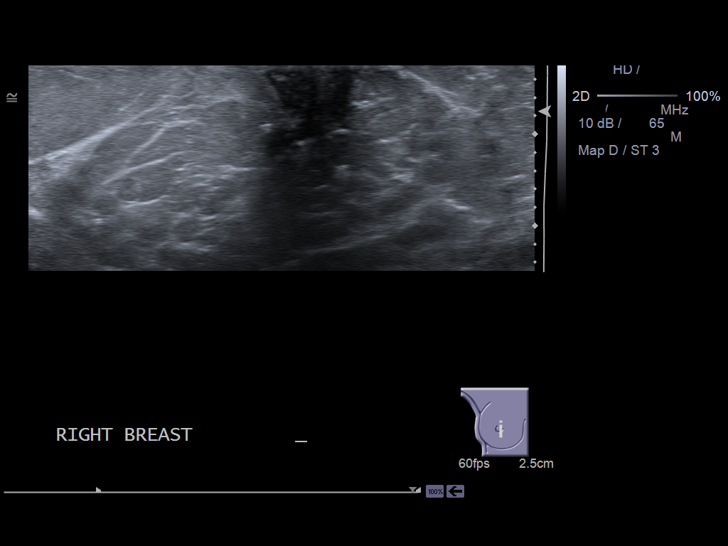

[14 of 17 positions shown; findings below may reference images not displayed]

FINDINGS: A hypoechoic irregularly bordered nodule with acoustic shadowing
is appreciated within the  retroareolar portion of the right breast directly
behind the nipple. This area measures 2.9 x 0.72 x 1.7 cm. Minimal
vascularity is identified along the periphery of the nodule. Sonographic
characteristics of this nodule are concerning and further evaluation with
surgical consultation recommended.
IMPRESSION: Concerning nodule directly behind the nipple as described
above please refer to the additional radiographic view dictation for
complete discussion.

## 2014-03-02 ENCOUNTER — Ambulatory Visit (INDEPENDENT_AMBULATORY_CARE_PROVIDER_SITE_OTHER): Payer: Medicare Other | Admitting: General Surgery

## 2014-03-02 ENCOUNTER — Encounter: Payer: Self-pay | Admitting: General Surgery

## 2014-03-02 VITALS — BP 132/70 | HR 80 | Resp 12 | Ht 71.0 in | Wt 231.0 lb

## 2014-03-02 DIAGNOSIS — I890 Lymphedema, not elsewhere classified: Secondary | ICD-10-CM

## 2014-03-02 DIAGNOSIS — Z853 Personal history of malignant neoplasm of breast: Secondary | ICD-10-CM

## 2014-03-02 NOTE — Patient Instructions (Signed)
Patient to follow up in 1 year. The patient is aware to call back for any questions or concerns.

## 2014-03-02 NOTE — Progress Notes (Signed)
Patient ID: Benjamin Poythress., male   DOB: [birth year], 73 y.o.   MRN: [id number]  Chief Complaint  Patient presents with  . Follow-up    1 year follow up breast cancer    HPI Benjamin Krause. is a 73 y.o. male who presents for a 1 year follow up breast cancer. No mammogram at this time. He is status post left mastectomy with chemotherapy for breast cancer. Doing well. No new problems at this time.   HPI  Past Medical History  Diagnosis Date  . Hypertension   . Arthritis   . Diabetes mellitus without complication   . Heart attack 2013  . Cancer 2010    left breast  invasive ductal carcinoma    Past Surgical History  Procedure Laterality Date  . Shoulder arthroscopy    . Coronary angioplasty with stent placement  2013  . I&d cyst on upper back   2007  . Port a cath revision  2010  . Mastectomy Left   . Port-a-cath removal    . Breast surgery    . Breast biopsy Right 09-09-12    BENIGN BREAST TISSUE WITH FIBROSIS    History reviewed. No pertinent family history.  Social History History  Substance Use Topics  . Smoking status: Current Every Day Smoker -- 1.00 packs/day for 40 years    Types: Cigarettes  . Smokeless tobacco: Never Used  . Alcohol Use: No    No Known Allergies  Current Outpatient Prescriptions  Medication Sig Dispense Refill  . aspirin 81 MG tablet Take 81 mg by mouth daily.      Marland Kitchen atorvastatin (LIPITOR) 80 MG tablet Take 80 mg by mouth daily.       . Calcium Carbonate-Vitamin D (CALCIUM + D PO) Take 1 tablet by mouth 2 (two) times daily.      Marland Kitchen glimepiride (AMARYL) 1 MG tablet Take 1 tablet by mouth as needed.      Marland Kitchen letrozole (FEMARA) 2.5 MG tablet Take 2.5 mg by mouth daily.       Marland Kitchen lisinopril (PRINIVIL,ZESTRIL) 2.5 MG tablet Take 2.5 mg by mouth daily.       . metFORMIN (GLUCOPHAGE) 1000 MG tablet Take 1,000 mg by mouth 2 (two) times daily.       . metoprolol succinate (TOPROL-XL) 50 MG 24 hr tablet Take 50 mg by mouth daily.        No current  facility-administered medications for this visit.    Review of Systems Review of Systems  Constitutional: Negative.   Respiratory: Negative.   Cardiovascular: Negative.     Blood pressure 132/70, pulse 80, resp. rate 12, height 5\' 11"  (1.803 m), weight 231 lb (104.781 kg).  Physical Exam Physical Exam  Constitutional: He is oriented to person, place, and time. He appears well-developed and well-nourished.  Eyes: Conjunctivae are normal. No scleral icterus.  Neck: Neck supple. No thyromegaly present.  Cardiovascular: Normal rate, regular rhythm and normal heart sounds.   No murmur heard. Pulmonary/Chest: Effort normal and breath sounds normal. Right breast exhibits no inverted nipple, no mass, no nipple discharge, no skin change and no tenderness.  Well healed left mastectomy site.   Lymphadenopathy:    He has no cervical adenopathy.    He has no axillary adenopathy.  Mild lymphedema in left arm.   Neurological: He is alert and oriented to person, place, and time.  Skin: Skin is warm and dry.    Data Reviewed  None  Assessment  7yrs post left breast cancer treatment.  Mild lymphedema in left arm. Otherwise exam stable.     Plan    Patient to follow up in 1 year          Laveda Demedeiros G 03/02/2014, 11:19 AM

## 2014-03-15 ENCOUNTER — Ambulatory Visit: Payer: Self-pay | Admitting: Oncology

## 2014-03-15 LAB — COMPREHENSIVE METABOLIC PANEL
Albumin: 3.5 g/dL (ref 3.4–5.0)
Alkaline Phosphatase: 97 U/L
Anion Gap: 6 — ABNORMAL LOW (ref 7–16)
BUN: 18 mg/dL (ref 7–18)
Bilirubin,Total: 0.4 mg/dL (ref 0.2–1.0)
CHLORIDE: 104 mmol/L (ref 98–107)
Calcium, Total: 9.4 mg/dL (ref 8.5–10.1)
Co2: 29 mmol/L (ref 21–32)
Creatinine: 1.14 mg/dL (ref 0.60–1.30)
EGFR (African American): >60
EGFR (Non-African Amer.): >60
Glucose: 178 mg/dL — ABNORMAL HIGH (ref 65–99)
Osmolality: 284 (ref 275–301)
Potassium: 4.4 mmol/L (ref 3.5–5.1)
SGOT(AST): 21 U/L (ref 15–37)
SGPT (ALT): 25 U/L
Sodium: 139 mmol/L (ref 136–145)
Total Protein: 6.7 g/dL (ref 6.4–8.2)

## 2014-03-15 LAB — CBC CANCER CENTER
BASOS ABS: 0.1 x10 3/mm (ref 0.0–0.1)
Basophil %: 1.3 %
EOS PCT: 1.1 %
Eosinophil #: 0.1 x10 3/mm (ref 0.0–0.7)
HCT: 42.2 % (ref 40.0–52.0)
HGB: 13.9 g/dL (ref 13.0–18.0)
Lymphocyte #: 0.9 x10 3/mm — ABNORMAL LOW (ref 1.0–3.6)
Lymphocyte %: 17.5 %
MCH: 30.5 pg (ref 26.0–34.0)
MCHC: 33 g/dL (ref 32.0–36.0)
MCV: 93 fL (ref 80–100)
MONOS PCT: 9.3 %
Monocyte #: 0.5 x10 3/mm (ref 0.2–1.0)
NEUTROS PCT: 70.8 %
Neutrophil #: 3.8 x10 3/mm (ref 1.4–6.5)
Platelet: 171 x10 3/mm (ref 150–440)
RBC: 4.56 10*6/uL (ref 4.40–5.90)
RDW: 16.4 % — ABNORMAL HIGH (ref 11.5–14.5)
WBC: 5.3 x10 3/mm (ref 3.8–10.6)

## 2014-03-16 LAB — CANCER ANTIGEN 27.29: CA 27.29: 22.7 U/mL (ref 0.0–38.6)

## 2014-04-04 ENCOUNTER — Ambulatory Visit: Payer: Self-pay | Admitting: Oncology

## 2014-09-20 ENCOUNTER — Ambulatory Visit
Admit: 2014-09-20 | Disposition: A | Discharge status: Home / Self Care | Payer: Self-pay | Attending: Oncology | Admitting: Oncology

## 2014-09-20 LAB — CBC CANCER CENTER
BASOS ABS: 0 x10 3/mm (ref 0.0–0.1)
Basophil %: 0.7 %
Eosinophil #: 0.1 x10 3/mm (ref 0.0–0.7)
Eosinophil %: 1.6 %
HCT: 39.9 % — AB (ref 40.0–52.0)
HGB: 13.3 g/dL (ref 13.0–18.0)
LYMPHS ABS: 0.8 x10 3/mm — AB (ref 1.0–3.6)
LYMPHS PCT: 14.1 %
MCH: 31.6 pg (ref 26.0–34.0)
MCHC: 33.4 g/dL (ref 32.0–36.0)
MCV: 95 fL (ref 80–100)
MONOS PCT: 8.5 %
Monocyte #: 0.5 x10 3/mm (ref 0.2–1.0)
NEUTROS PCT: 75.1 %
Neutrophil #: 4 x10 3/mm (ref 1.4–6.5)
Platelet: 157 x10 3/mm (ref 150–440)
RBC: 4.23 10*6/uL — ABNORMAL LOW (ref 4.40–5.90)
RDW: 15.9 % — AB (ref 11.5–14.5)
WBC: 5.3 x10 3/mm (ref 3.8–10.6)

## 2014-09-20 LAB — BASIC METABOLIC PANEL
Anion Gap: 6 — ABNORMAL LOW (ref 7–16)
BUN: 17 mg/dL
CHLORIDE: 101 mmol/L
CO2: 29 mmol/L
CREATININE: 1.09 mg/dL
Calcium, Total: 8.9 mg/dL
EGFR (African American): >60
EGFR (Non-African Amer.): >60
Glucose: 209 mg/dL — ABNORMAL HIGH
Potassium: 3.7 mmol/L
Sodium: 136 mmol/L

## 2014-09-21 LAB — CANCER ANTIGEN 27.29: CA 27.29: 20.9 U/mL (ref 0.0–38.6)

## 2014-09-23 ENCOUNTER — Other Ambulatory Visit: Payer: Self-pay | Admitting: Oncology

## 2014-09-23 DIAGNOSIS — M899 Disorder of bone, unspecified: Secondary | ICD-10-CM

## 2014-09-23 DIAGNOSIS — Z9012 Acquired absence of left breast and nipple: Secondary | ICD-10-CM

## 2014-09-23 DIAGNOSIS — Z853 Personal history of malignant neoplasm of breast: Secondary | ICD-10-CM

## 2014-10-05 ENCOUNTER — Ambulatory Visit
Admission: RE | Admit: 2014-10-05 | Discharge: 2014-10-05 | Disposition: A | Discharge status: Home / Self Care | Payer: Medicare Other | Source: Ambulatory Visit | Attending: Oncology | Admitting: Oncology

## 2014-10-05 DIAGNOSIS — Z853 Personal history of malignant neoplasm of breast: Secondary | ICD-10-CM

## 2014-10-05 DIAGNOSIS — Z9012 Acquired absence of left breast and nipple: Secondary | ICD-10-CM | POA: Diagnosis not present

## 2014-10-05 DIAGNOSIS — M899 Disorder of bone, unspecified: Secondary | ICD-10-CM

## 2014-10-05 DIAGNOSIS — F17210 Nicotine dependence, cigarettes, uncomplicated: Secondary | ICD-10-CM | POA: Insufficient documentation

## 2014-10-05 HISTORY — DX: Malignant neoplasm of unspecified site of unspecified female breast: C50.919

## 2014-10-06 ENCOUNTER — Other Ambulatory Visit: Payer: Self-pay

## 2014-10-06 ENCOUNTER — Ambulatory Visit: Payer: Self-pay

## 2014-12-21 ENCOUNTER — Encounter: Payer: Self-pay | Admitting: *Deleted

## 2014-12-21 NOTE — Progress Notes (Signed)
Despite multiple attempts to contact patient at all numbers available was not able to speak with patient. Will be happy to assist with CT screening in the future if patient desires.

## 2015-02-15 ENCOUNTER — Ambulatory Visit: Payer: Self-pay | Admitting: General Surgery

## 2015-02-17 ENCOUNTER — Ambulatory Visit: Payer: Self-pay | Admitting: General Surgery

## 2015-02-21 ENCOUNTER — Ambulatory Visit (INDEPENDENT_AMBULATORY_CARE_PROVIDER_SITE_OTHER): Payer: Medicare Other | Admitting: General Surgery

## 2015-02-21 ENCOUNTER — Encounter: Payer: Self-pay | Admitting: General Surgery

## 2015-02-21 VITALS — BP 126/68 | HR 68 | Resp 14 | Ht 71.0 in | Wt 236.0 lb

## 2015-02-21 DIAGNOSIS — Z853 Personal history of malignant neoplasm of breast: Secondary | ICD-10-CM | POA: Diagnosis not present

## 2015-02-21 DIAGNOSIS — I890 Lymphedema, not elsewhere classified: Secondary | ICD-10-CM

## 2015-02-21 NOTE — Patient Instructions (Signed)
Call if any changes, concerns arise.

## 2015-02-21 NOTE — Progress Notes (Signed)
Patient ID: Benjamin Keep., male   DOB: 12-12-1940, 74 y.o.   MRN: [id number]  Chief Complaint  Patient presents with  . Follow-up    History breast cancer    HPI Benjamin Thayer. is a 74 y.o. male here today for follow up for breast cancer. Patient states he is doing well, no breast problems.  Patient has some lymphedema in left arm, he has sleeve, but does not wear it.    HPI  Past Medical History  Diagnosis Date  . Hypertension   . Arthritis   . Diabetes mellitus without complication   . Heart attack 2013  . Cancer 2010    left breast  invasive ductal carcinoma  . Carcinoma of breast treated with adjuvant chemotherapy 2011    Past Surgical History  Procedure Laterality Date  . Shoulder arthroscopy    . Coronary angioplasty with stent placement  2013  . I&d cyst on upper back   2007  . Port a cath revision  2010  . Port-a-cath removal    . Breast surgery    . Breast biopsy Right 09-09-12    BENIGN BREAST TISSUE WITH FIBROSIS  . Mastectomy Left 2011    History reviewed. No pertinent family history.  Social History Social History  Substance Use Topics  . Smoking status: Current Every Day Smoker -- 1.00 packs/day for 40 years    Types: Cigarettes  . Smokeless tobacco: Never Used  . Alcohol Use: No    No Known Allergies  Current Outpatient Prescriptions  Medication Sig Dispense Refill  . aspirin 81 MG tablet Take 81 mg by mouth daily.    Marland Kitchen atorvastatin (LIPITOR) 80 MG tablet Take 80 mg by mouth daily.     . Calcium Carbonate-Vitamin D (CALCIUM + D PO) Take 1 tablet by mouth 2 (two) times daily.    Marland Kitchen letrozole (FEMARA) 2.5 MG tablet Take 2.5 mg by mouth daily.     Marland Kitchen lisinopril (PRINIVIL,ZESTRIL) 2.5 MG tablet Take 2.5 mg by mouth daily.     . metFORMIN (GLUCOPHAGE) 1000 MG tablet Take 1,000 mg by mouth 2 (two) times daily.     . metoprolol succinate (TOPROL-XL) 50 MG 24 hr tablet Take 50 mg by mouth daily.      No current facility-administered medications for  this visit.    Review of Systems Review of Systems  Constitutional: Negative.   Respiratory: Negative.   Cardiovascular: Negative.     Blood pressure 126/68, pulse 68, resp. rate 14, height 5\' 11"  (1.803 m), weight 236 lb (107.049 kg).  Physical Exam Physical Exam  Constitutional: He is oriented to person, place, and time. He appears well-developed and well-nourished.  Eyes: Conjunctivae are normal. No scleral icterus.  Neck: Neck supple.  Cardiovascular: Normal rate, regular rhythm and normal heart sounds.   Pulmonary/Chest: Effort normal and breath sounds normal. Right breast exhibits no inverted nipple, no mass, no nipple discharge, no skin change and no tenderness.  Mastectomy side remains well-healed with some minimal deformity. No palpable findings suggestive of recurrence.   Abdominal: Soft. Bowel sounds are normal. There is no hepatomegaly. There is no tenderness.  Musculoskeletal: He exhibits edema.  Left arm mild-moderate lymphedema, remains stable.   Lymphadenopathy:    He has no cervical adenopathy.    He has no axillary adenopathy.  Neurological: He is alert and oriented to person, place, and time.  Skin: Skin is warm and dry.  Psychiatric: His behavior is normal.  Data Reviewed Prior notes  Assessment CA left breast 6 years post-treatment with mastectomy, chemoradiation, currently continued on Femara.  Stable exam. Left arm lymphedema appears to be stable.    Plan 1 year follow-up.      PCP: No PCP  SANKAR,SEEPLAPUTHUR G 02/23/2015, 8:35 AM

## 2015-02-23 ENCOUNTER — Encounter: Payer: Self-pay | Admitting: General Surgery

## 2015-03-22 ENCOUNTER — Ambulatory Visit: Payer: Medicare Other | Admitting: Oncology

## 2015-03-22 ENCOUNTER — Other Ambulatory Visit: Payer: Medicare Other

## 2015-03-25 ENCOUNTER — Other Ambulatory Visit: Payer: Self-pay | Admitting: Oncology

## 2015-04-01 ENCOUNTER — Other Ambulatory Visit: Payer: Self-pay | Admitting: *Deleted

## 2015-04-01 DIAGNOSIS — Z853 Personal history of malignant neoplasm of breast: Secondary | ICD-10-CM

## 2015-04-05 ENCOUNTER — Encounter: Payer: Self-pay | Admitting: Oncology

## 2015-04-05 ENCOUNTER — Inpatient Hospital Stay: Payer: Medicare Other | Attending: Oncology

## 2015-04-05 ENCOUNTER — Inpatient Hospital Stay (HOSPITAL_BASED_OUTPATIENT_CLINIC_OR_DEPARTMENT_OTHER): Payer: Medicare Other | Admitting: Oncology

## 2015-04-05 VITALS — BP 118/65 | HR 75 | Temp 96.8°F | Wt 236.3 lb

## 2015-04-05 DIAGNOSIS — Z170 Estrogen receptor positive status [ER+]: Secondary | ICD-10-CM

## 2015-04-05 DIAGNOSIS — Z716 Tobacco abuse counseling: Secondary | ICD-10-CM | POA: Diagnosis not present

## 2015-04-05 DIAGNOSIS — Z7982 Long term (current) use of aspirin: Secondary | ICD-10-CM | POA: Insufficient documentation

## 2015-04-05 DIAGNOSIS — F17210 Nicotine dependence, cigarettes, uncomplicated: Secondary | ICD-10-CM

## 2015-04-05 DIAGNOSIS — I10 Essential (primary) hypertension: Secondary | ICD-10-CM | POA: Diagnosis not present

## 2015-04-05 DIAGNOSIS — Z79899 Other long term (current) drug therapy: Secondary | ICD-10-CM | POA: Diagnosis not present

## 2015-04-05 DIAGNOSIS — Z853 Personal history of malignant neoplasm of breast: Secondary | ICD-10-CM

## 2015-04-05 DIAGNOSIS — Z79811 Long term (current) use of aromatase inhibitors: Secondary | ICD-10-CM | POA: Diagnosis not present

## 2015-04-05 DIAGNOSIS — Z7984 Long term (current) use of oral hypoglycemic drugs: Secondary | ICD-10-CM | POA: Diagnosis not present

## 2015-04-05 DIAGNOSIS — E119 Type 2 diabetes mellitus without complications: Secondary | ICD-10-CM | POA: Insufficient documentation

## 2015-04-05 LAB — CBC WITH DIFFERENTIAL/PLATELET
BASOS ABS: 0.1 10*3/uL (ref 0–0.1)
Basophils Relative: 1 %
EOS ABS: 0.1 10*3/uL (ref 0–0.7)
Eosinophils Relative: 1 %
HEMATOCRIT: 39.8 % — AB (ref 40.0–52.0)
HEMOGLOBIN: 13.3 g/dL (ref 13.0–18.0)
Lymphocytes Relative: 22 %
Lymphs Abs: 1.2 10*3/uL (ref 1.0–3.6)
MCH: 30.4 pg (ref 26.0–34.0)
MCHC: 33.5 g/dL (ref 32.0–36.0)
MCV: 90.6 fL (ref 80.0–100.0)
Monocytes Absolute: 0.6 10*3/uL (ref 0.2–1.0)
Monocytes Relative: 11 %
NEUTROS ABS: 3.6 10*3/uL (ref 1.4–6.5)
NEUTROS PCT: 65 %
Platelets: 152 10*3/uL (ref 150–440)
RBC: 4.39 MIL/uL — ABNORMAL LOW (ref 4.40–5.90)
RDW: 15.8 % — ABNORMAL HIGH (ref 11.5–14.5)
WBC: 5.5 10*3/uL (ref 3.8–10.6)

## 2015-04-05 LAB — COMPREHENSIVE METABOLIC PANEL
ALK PHOS: 73 U/L (ref 38–126)
ALT: 17 U/L (ref 17–63)
ANION GAP: 3 — AB (ref 5–15)
AST: 25 U/L (ref 15–41)
Albumin: 3.8 g/dL (ref 3.5–5.0)
BUN: 18 mg/dL (ref 6–20)
CALCIUM: 8.9 mg/dL (ref 8.9–10.3)
CO2: 29 mmol/L (ref 22–32)
Chloride: 106 mmol/L (ref 101–111)
Creatinine, Ser: 1.2 mg/dL (ref 0.61–1.24)
GFR calc Af Amer: >60 mL/min (ref >60)
GFR calc non Af Amer: 57 mL/min — ABNORMAL LOW (ref >60)
Glucose, Bld: 130 mg/dL — ABNORMAL HIGH (ref 65–99)
Potassium: 4 mmol/L (ref 3.5–5.1)
SODIUM: 138 mmol/L (ref 135–145)
Total Bilirubin: 0.4 mg/dL (ref 0.3–1.2)
Total Protein: 6.9 g/dL (ref 6.5–8.1)
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
## 2015-04-05 NOTE — Progress Notes (Signed)
Patient here today for mammogram and bone density results.

## 2015-04-07 ENCOUNTER — Telehealth: Payer: Self-pay | Admitting: *Deleted

## 2015-04-07 NOTE — Telephone Encounter (Signed)
Calling regarding breast cancer index testing.  The specimen was taken after patient had therapy.  The biopsy needed should be before therapy.  Please call with questions/concerns.

## 2015-04-08 NOTE — Telephone Encounter (Signed)
Found original pathology for 07/2008. Report faxed to [facility] to perform testing on that specimen.

## 2015-04-15 ENCOUNTER — Encounter: Payer: Self-pay | Admitting: Oncology

## 2015-04-29 ENCOUNTER — Other Ambulatory Visit: Payer: Self-pay | Admitting: Oncology

## 2015-05-03 ENCOUNTER — Telehealth: Payer: Self-pay | Admitting: *Deleted

## 2015-05-03 NOTE — Telephone Encounter (Signed)
Called patient and left message that BCI testing shows there is a low benefit in continuing Letrozole.  Per MD patient can stop Letrozole at this time and keep next follow up appointment.

## 2015-05-03 NOTE — Telephone Encounter (Signed)
Results from Breast Cancer Index testing showed low likelihood of benefit from extended therapy with letrozole. Pt may stop taking letrozole at this time. MD will follow up with patient at next appt.

## 2015-05-18 ENCOUNTER — Encounter: Payer: Self-pay | Admitting: Oncology

## 2015-06-01 ENCOUNTER — Other Ambulatory Visit: Payer: Self-pay | Admitting: Oncology

## 2015-09-29 ENCOUNTER — Other Ambulatory Visit: Payer: Self-pay | Admitting: Oncology

## 2015-10-03 ENCOUNTER — Other Ambulatory Visit: Payer: Medicare Other

## 2015-10-03 ENCOUNTER — Ambulatory Visit: Payer: Medicare Other | Admitting: Oncology

## 2015-10-26 ENCOUNTER — Inpatient Hospital Stay: Payer: Medicare Other | Admitting: Oncology

## 2015-10-26 ENCOUNTER — Inpatient Hospital Stay: Payer: Medicare Other | Attending: Oncology

## 2015-11-02 ENCOUNTER — Inpatient Hospital Stay: Payer: Medicare Other | Admitting: Oncology

## 2015-11-02 ENCOUNTER — Inpatient Hospital Stay: Payer: Medicare Other

## 2015-11-14 ENCOUNTER — Other Ambulatory Visit: Payer: Self-pay | Admitting: Oncology

## 2015-11-18 ENCOUNTER — Other Ambulatory Visit: Payer: Self-pay | Admitting: Oncology

## 2016-01-11 ENCOUNTER — Encounter: Payer: Self-pay | Admitting: *Deleted

## 2016-02-03 DEATH — deceased

## 2016-02-27 ENCOUNTER — Ambulatory Visit: Payer: Medicare Other | Admitting: General Surgery
